# Patient Record
Sex: Male | Born: 1990 | Race: White | Hispanic: No
Health system: Southern US, Community
[De-identification: ages and names within clinical notes are randomized; demographics above are authoritative.]

---

## 2021-01-27 ENCOUNTER — Other Ambulatory Visit: Payer: Self-pay

## 2021-01-27 ENCOUNTER — Emergency Department (HOSPITAL_COMMUNITY)
Admission: EM | Admit: 2021-01-27 | Discharge: 2021-01-28 | Disposition: A | Discharge status: Home / Self Care | Payer: Self-pay | Attending: Emergency Medicine | Admitting: Emergency Medicine

## 2021-01-27 DIAGNOSIS — R109 Unspecified abdominal pain: Secondary | ICD-10-CM | POA: Insufficient documentation

## 2021-01-27 MED ORDER — OXYCODONE-ACETAMINOPHEN 5-325 MG PO TABS
1.0000 | ORAL_TABLET | ORAL | Status: DC | PRN
  Administered 2021-01-27: 1 via ORAL
  Filled 2021-01-27: qty 1

## 2021-01-27 NOTE — ED Triage Notes (Signed)
Pt presents to ED POV. Pt c/o L flank pain. Denies GU s/s. No hx of kidney stones.

## 2021-01-28 ENCOUNTER — Emergency Department (HOSPITAL_COMMUNITY): Payer: Self-pay

## 2021-01-28 LAB — URINALYSIS, ROUTINE W REFLEX MICROSCOPIC
Bilirubin Urine: NEGATIVE
Glucose, UA: NEGATIVE mg/dL
Hgb urine dipstick: NEGATIVE
Ketones, ur: 5 mg/dL — AB
Leukocytes,Ua: NEGATIVE
Nitrite: NEGATIVE
Protein, ur: NEGATIVE mg/dL
Specific Gravity, Urine: 1.029 (ref 1.005–1.030)
pH: 5 (ref 5.0–8.0)

## 2021-01-28 MED ORDER — KETOROLAC TROMETHAMINE 60 MG/2ML IM SOLN
60.0000 mg | Freq: Once | INTRAMUSCULAR | Status: AC
  Administered 2021-01-28: 60 mg via INTRAMUSCULAR
  Filled 2021-01-28: qty 2

## 2021-01-28 MED ORDER — METHOCARBAMOL 500 MG PO TABS: 500.0000 mg | ORAL_TABLET | Freq: Two times a day (BID) | ORAL | 0 refills | Status: AC

## 2021-01-28 MED ORDER — IBUPROFEN 800 MG PO TABS: 800.0000 mg | ORAL_TABLET | Freq: Three times a day (TID) | ORAL | 0 refills | Status: AC

## 2021-01-28 NOTE — ED Provider Notes (Signed)
[facility] EMERGENCY DEPARTMENT Provider Note   CSN: [id number] Arrival date & time: 01/27/21  2338     History Chief Complaint  Patient presents with  . Flank Pain    Danil Wedge is a 30 y.o. male.  The history is provided by the patient and medical records.  Flank Pain    30 year old male presenting to the ED with flank pain.  States of the past week he has had pain "in his kidneys".  States today it seemed a little worse on the left.  Pain has been worse with movement.  He does not recall any specific injury or heavy lifting that would have caused this.  He denies any difficulty urinating, states urine has looked dark in color.  Denies any abdominal pain, nausea, or vomiting.  No history of kidney stones.  Has been taking Tylenol at home for pain without relief.  No past medical history on file.  There are no problems to display for this patient.      No family history on file.     Home Medications Prior to Admission medications   Not on File    Allergies    Patient has no known allergies.  Review of Systems   Review of Systems  Genitourinary: Positive for flank pain.  All other systems reviewed and are negative.   Physical Exam Updated Vital Signs BP 120/71 (BP Location: Right Arm)   Pulse 77   Temp 98.2 F (36.8 C) (Oral)   Resp 20   SpO2 99%   Physical Exam Vitals and nursing note reviewed.  Constitutional:      Appearance: He is well-developed and well-nourished.  HENT:     Head: Normocephalic and atraumatic.     Mouth/Throat:     Mouth: Oropharynx is clear and moist.  Eyes:     Extraocular Movements: EOM normal.     Conjunctiva/sclera: Conjunctivae normal.     Pupils: Pupils are equal, round, and reactive to light.  Cardiovascular:     Rate and Rhythm: Normal rate and regular rhythm.     Heart sounds: Normal heart sounds.  Pulmonary:     Effort: Pulmonary effort is normal.     Breath sounds: Normal breath sounds.   Abdominal:     General: Bowel sounds are normal.     Palpations: Abdomen is soft.     Tenderness: There is no abdominal tenderness. There is no guarding or rebound.     Comments: No CVA tenderness noted  Musculoskeletal:        General: Normal range of motion.     Cervical back: Normal range of motion.     Comments: No reproducible tenderness along the back, no midline deformities or step-off, elicited pain with turning torso to left  Skin:    General: Skin is warm and dry.  Neurological:     Mental Status: He is alert and oriented to person, place, and time.  Psychiatric:        Mood and Affect: Mood and affect normal.     ED Results / Procedures / Treatments   Labs (all labs ordered are listed, but only abnormal results are displayed) Labs Reviewed  URINALYSIS, ROUTINE W REFLEX MICROSCOPIC - Abnormal; Notable for the following components:      Result Value   Ketones, ur 5 (*)    All other components within normal limits    EKG None  Radiology CT Renal Stone Study  Result Date: 01/28/2021 CLINICAL DATA:  Flank pain EXAM: CT ABDOMEN AND PELVIS WITHOUT CONTRAST TECHNIQUE: Multidetector CT imaging of the abdomen and pelvis was performed following the standard protocol without IV contrast. COMPARISON:  None. FINDINGS: Lower chest: No acute abnormality. Hepatobiliary: No focal liver abnormality is seen. No gallstones, gallbladder wall thickening, or biliary dilatation. Pancreas: Unremarkable. No pancreatic ductal dilatation or surrounding inflammatory changes. Spleen: Normal in size without focal abnormality. Adrenals/Urinary Tract: Adrenal glands are within normal limits. Kidneys demonstrate no renal calculi or obstructive changes. Bladder is decompressed. Stomach/Bowel: No obstructive or inflammatory changes of the colon are noted. Appendix is within normal limits. Small bowel and stomach are within normal limits. Vascular/Lymphatic: No significant vascular findings are present. No  enlarged abdominal or pelvic lymph nodes. Reproductive: Prostate is unremarkable. Other: No abdominal wall hernia or abnormality. No abdominopelvic ascites. Musculoskeletal: No acute or significant osseous findings. IMPRESSION: No acute abnormality noted. Electronically Signed   By: Alcide Clever M.D.   On: 01/28/2021 01:36    Procedures Procedures  Medications Ordered in ED Medications  oxyCODONE-acetaminophen (PERCOCET/ROXICET) 5-325 MG per tablet 1 tablet (1 tablet Oral Given 01/27/21 2356)  ketorolac (TORADOL) injection 60 mg (60 mg Intramuscular Given 01/28/21 0202)    ED Course  I have reviewed the triage vital signs and the nursing notes.  Pertinent labs & imaging results that were available during my care of the patient were reviewed by me and considered in my medical decision making (see chart for details).    MDM Rules/Calculators/A&P  30 y.o. M here with bilateral flank pain for the past week. Now more localized to left side.  Denies any injury, trauma, or heavy lifting.  Denies urinary symptoms.  He is afebrile, non-toxic.  No CVA tenderness, no reproducible muscular tenderness.  Does have elicited pain in left back when turning left.  No bowel/bladder incontinence.  UA without blood or signs of infection.  CT obtained, no acute findings.  He has no chest pain, SOB, pleuritic pain, tachycardia or hypoxia.  Doubt ACS, PE, dissection, etc.  May very well be MSK.  Will plan to treat symptomatically and have him follow-up with wellness clinic for re-check.  Return here for new concerns.  Final Clinical Impression(s) / ED Diagnoses Final diagnoses:  Flank pain    Rx / DC Orders ED Discharge Orders         Ordered    methocarbamol (ROBAXIN) 500 MG tablet  2 times daily        01/28/21 0259    ibuprofen (ADVIL) 800 MG tablet  3 times daily        01/28/21 0259           Garlon Hatchet, PA-C 01/28/21 0405    Zadie Rhine, MD 01/28/21 [phone]

## 2021-01-28 NOTE — Discharge Instructions (Signed)
Take the prescribed medication as directed.  Can try using heat/ice on the back as well. Follow-up with the wellness clinic-- I would call Monday to try and get this set up.  This is our free clinic. Return to the ED for new or worsening symptoms.

## 2022-07-02 IMAGING — CT CT RENAL STONE PROTOCOL
2 of 4 series · 17 of 46 positions shown, 19 images · non-contrast
Comparison: None.

CLINICAL DATA: Flank pain

EXAM:
CT ABDOMEN AND PELVIS WITHOUT CONTRAST
TECHNIQUE: Multidetector CT imaging of the abdomen and pelvis was performed
following the standard protocol without IV contrast.

[Series 3: renal stone 5.0 · axial · 0.84mm/px · z∈[-827,-422]mm · 14 of 89 slices shown, 16 images]
[im 4/89  soft-tissue]
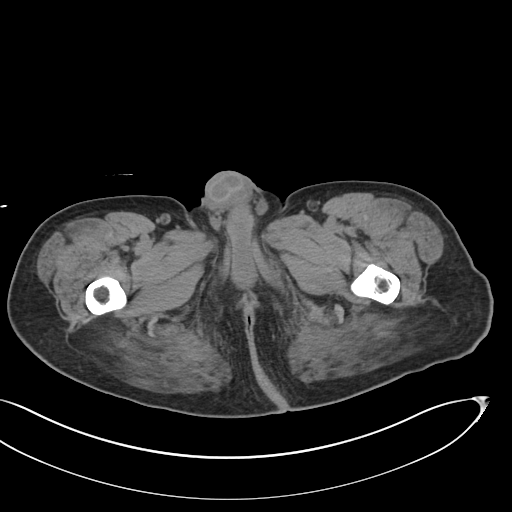
[im 4/89  bone]
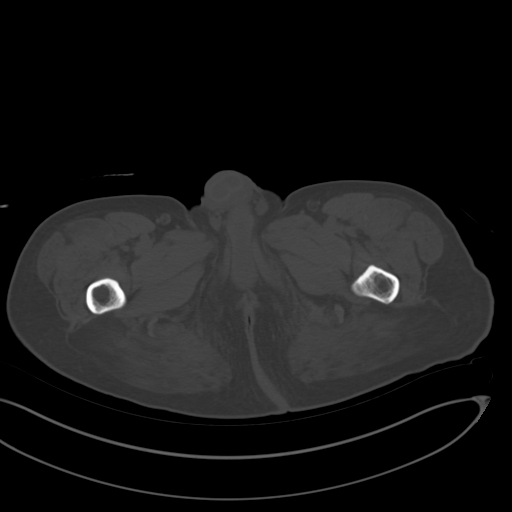
[im 11/89  soft-tissue]
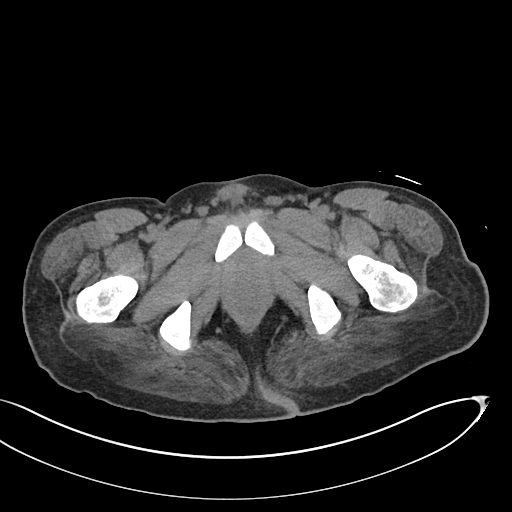
[im 18/89  soft-tissue]
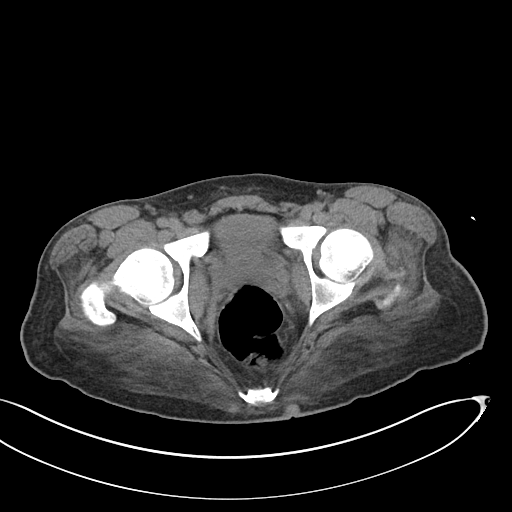
[im 25/89  soft-tissue]
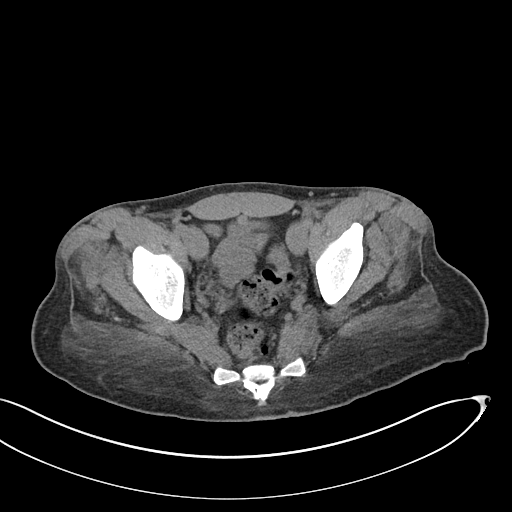
[im 29/89  soft-tissue]
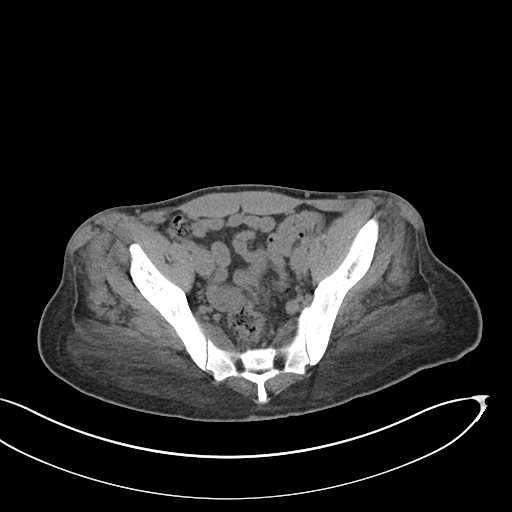
[im 36/89  soft-tissue]
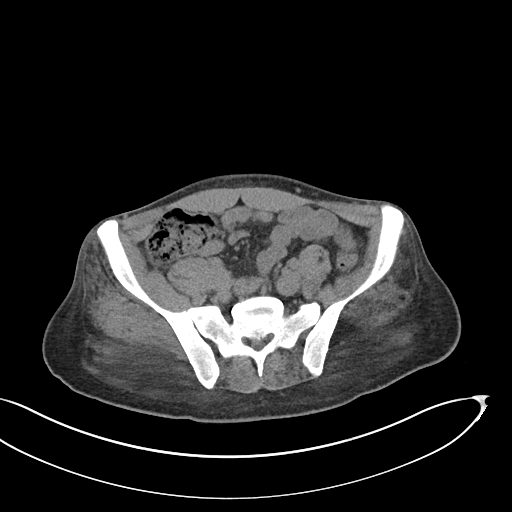
[im 43/89  soft-tissue]
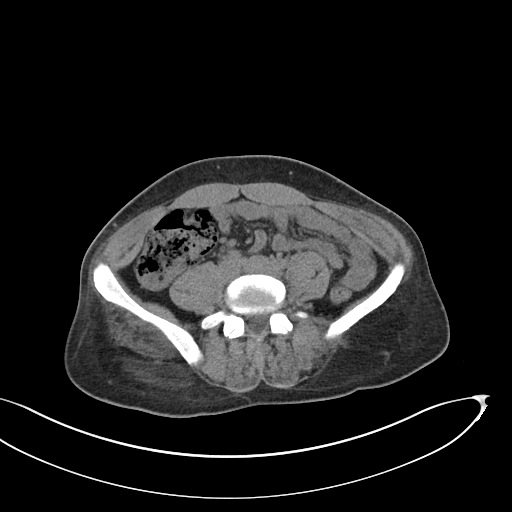
[im 46/89  soft-tissue]
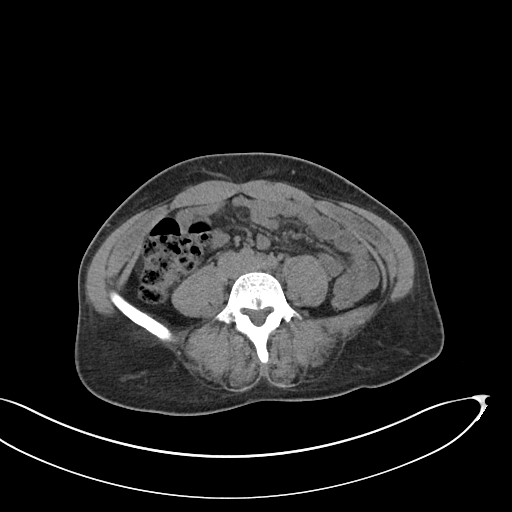
[im 53/89  soft-tissue]
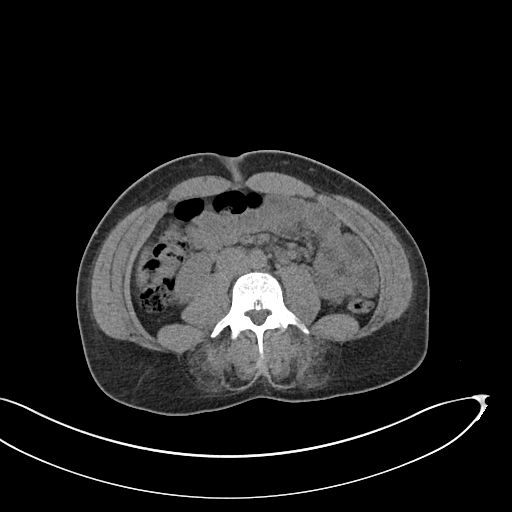
[im 53/89  bone]
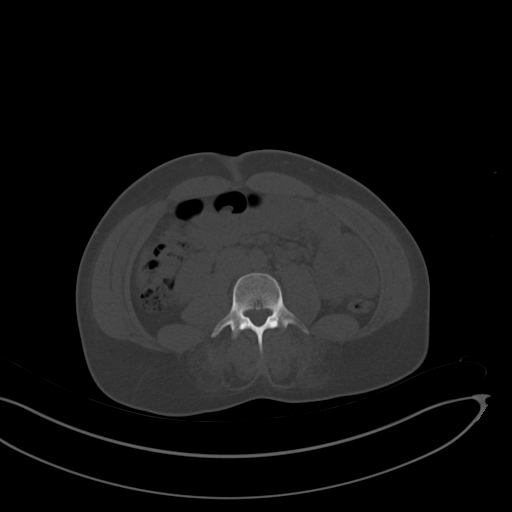
[im 60/89  soft-tissue]
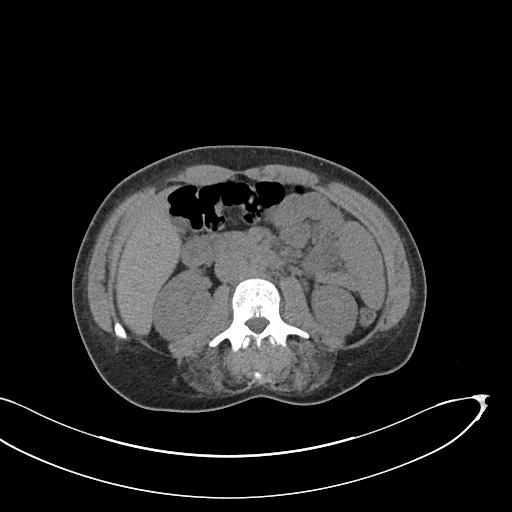
[im 67/89  soft-tissue]
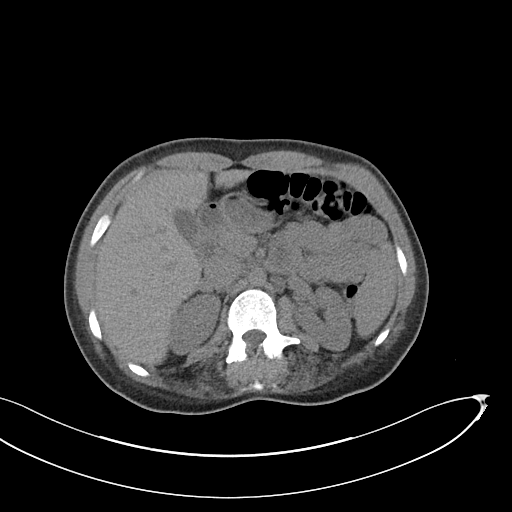
[im 71/89  soft-tissue]
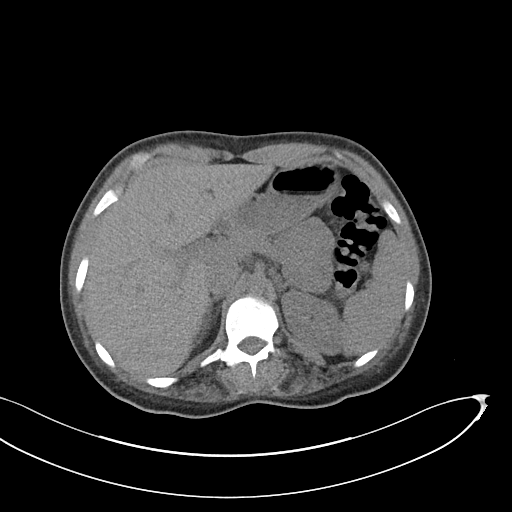
[im 78/89  soft-tissue]
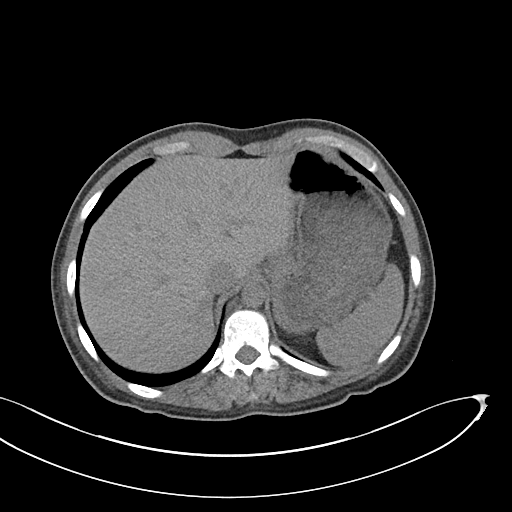
[im 85/89  soft-tissue]
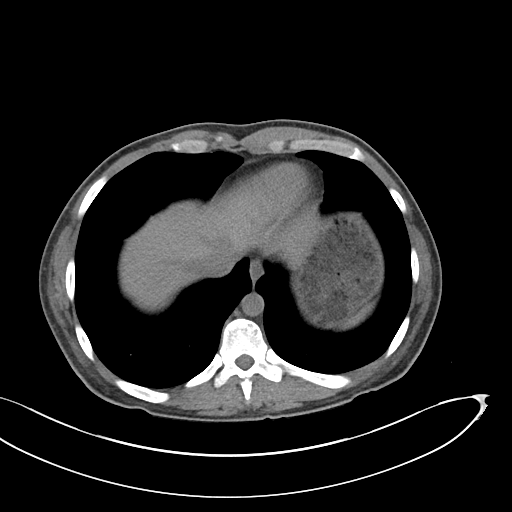

[Series 5: coronal · coronal · 0.87mm/px · 3 of 91 slices shown]
[im 31/91  soft-tissue]
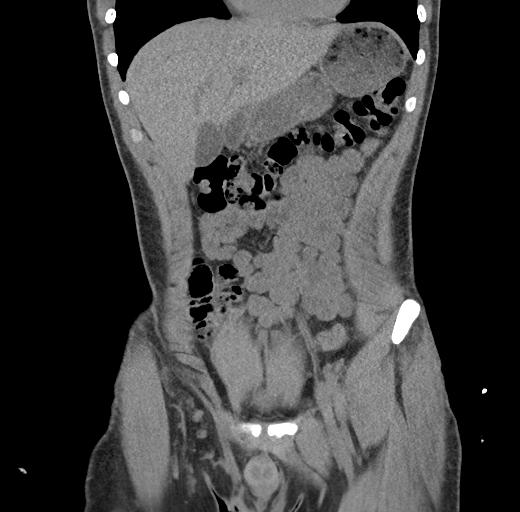
[im 41/91  soft-tissue]
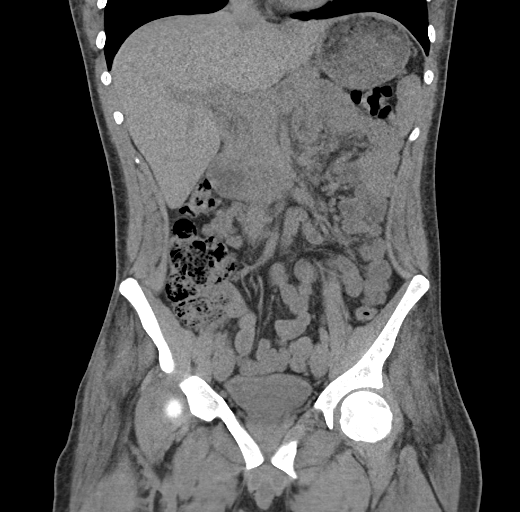
[im 51/91  soft-tissue]
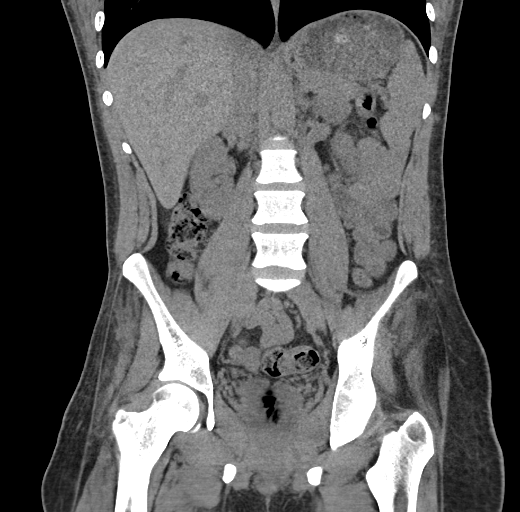

[17 of 46 positions shown; findings below may reference images not displayed]

FINDINGS: Lower chest: No acute abnormality.

Hepatobiliary: No focal liver abnormality is seen. No gallstones,
gallbladder wall thickening, or biliary dilatation.

Pancreas: Unremarkable. No pancreatic ductal dilatation or
surrounding inflammatory changes.

Spleen: Normal in size without focal abnormality.

Adrenals/Urinary Tract: Adrenal glands are within normal limits.
Kidneys demonstrate no renal calculi or obstructive changes. Bladder
is decompressed.

Stomach/Bowel: No obstructive or inflammatory changes of the colon
are noted. Appendix is within normal limits. Small bowel and stomach
are within normal limits.

Vascular/Lymphatic: No significant vascular findings are present. No
enlarged abdominal or pelvic lymph nodes.

Reproductive: Prostate is unremarkable.

Other: No abdominal wall hernia or abnormality. No abdominopelvic
ascites.

Musculoskeletal: No acute or significant osseous findings.
IMPRESSION: No acute abnormality noted.
